# Patient Record
Sex: Male | Born: 1990 | Race: White | Hispanic: No | Marital: Single | Smoking: Current every day smoker
Health system: Southern US, Community
[De-identification: ages and names within clinical notes are randomized; demographics above are authoritative.]

---

## 2018-07-28 ENCOUNTER — Encounter (HOSPITAL_COMMUNITY): Payer: Self-pay

## 2018-07-28 ENCOUNTER — Emergency Department (HOSPITAL_COMMUNITY): Payer: Self-pay

## 2018-07-28 ENCOUNTER — Other Ambulatory Visit: Payer: Self-pay

## 2018-07-28 ENCOUNTER — Emergency Department (HOSPITAL_COMMUNITY)
Admission: EM | Admit: 2018-07-28 | Discharge: 2018-07-28 | Disposition: A | Discharge status: Home / Self Care | Payer: Self-pay | Attending: Emergency Medicine | Admitting: Emergency Medicine

## 2018-07-28 DIAGNOSIS — J40 Bronchitis, not specified as acute or chronic: Secondary | ICD-10-CM | POA: Insufficient documentation

## 2018-07-28 DIAGNOSIS — F17210 Nicotine dependence, cigarettes, uncomplicated: Secondary | ICD-10-CM | POA: Insufficient documentation

## 2018-07-28 MED ORDER — PREDNISONE 50 MG PO TABS: 50.0000 mg | ORAL_TABLET | Freq: Every day | ORAL | 0 refills | Status: AC

## 2018-07-28 MED ORDER — PREDNISONE 20 MG PO TABS
60.0000 mg | ORAL_TABLET | Freq: Once | ORAL | Status: AC
  Administered 2018-07-28: 60 mg via ORAL
  Filled 2018-07-28: qty 3

## 2018-07-28 MED ORDER — ALBUTEROL SULFATE HFA 108 (90 BASE) MCG/ACT IN AERS
1.0000 | INHALATION_SPRAY | RESPIRATORY_TRACT | Status: DC | PRN
  Administered 2018-07-28: 2 via RESPIRATORY_TRACT
  Filled 2018-07-28 (×2): qty 6.7

## 2018-07-28 MED ORDER — IPRATROPIUM-ALBUTEROL 0.5-2.5 (3) MG/3ML IN SOLN
3.0000 mL | Freq: Once | RESPIRATORY_TRACT | Status: AC
  Administered 2018-07-28: 3 mL via RESPIRATORY_TRACT
  Filled 2018-07-28: qty 3

## 2018-07-28 NOTE — ED Provider Notes (Signed)
[facility]-EMERGENCY DEPT Provider Note   CSN: [id number] Arrival date & time: 07/28/18  1042    History   Chief Complaint Chief Complaint  Patient presents with  . Cough    HPI Alexander Chang is a 28 y.o. male.   HPI Patient presented to the emergency room for evaluation of shortness of breath.  Patient states he felt like he was getting a flulike illness over the weekend.  He felt feverish and chilled.  Those symptoms resolved but he continues to feel congestion in his chest.  He has been coughing.  Last evening his symptoms got worse and he was feeling short of breath.  He continues to feel like he has difficulty getting in a deep breath.  He denies any trouble with chest pain.  He denies any leg swelling.  He denies any abdominal pain.  Patient denies any history of asthma.  He does smoke cigarettes. History reviewed. No pertinent past medical history.  There are no active problems to display for this patient.   History reviewed. No pertinent surgical history.      Home Medications    Prior to Admission medications   Medication Sig Start Date End Date Taking? Authorizing Provider  predniSONE (DELTASONE) 50 MG tablet Take 1 tablet (50 mg total) by mouth daily. 07/28/18   Linwood Dibbles, MD    Family History History reviewed. No pertinent family history.  Social History Social History   Tobacco Use  . Smoking status: Current Every Day Smoker    Types: Cigarettes  . Smokeless tobacco: Never Used  Substance Use Topics  . Alcohol use: Never    Frequency: Never  . Drug use: Never     Allergies   Patient has no known allergies.   Review of Systems Review of Systems  All other systems reviewed and are negative.    Physical Exam Updated Vital Signs BP 140/89 (BP Location: Right Arm)   Pulse 87   Temp 98.3 F (36.8 C) (Oral)   Resp 18   Ht 1.702 m (5\' 7" )   Wt 90.7 kg   SpO2 99%   BMI 31.32 kg/m   Physical Exam Vitals signs and  nursing note reviewed.  Constitutional:      General: He is not in acute distress.    Appearance: He is well-developed.  HENT:     Head: Normocephalic and atraumatic.     Right Ear: External ear normal.     Left Ear: External ear normal.  Eyes:     General: No scleral icterus.       Right eye: No discharge.        Left eye: No discharge.     Conjunctiva/sclera: Conjunctivae normal.  Neck:     Musculoskeletal: Neck supple.     Trachea: No tracheal deviation.  Cardiovascular:     Rate and Rhythm: Normal rate and regular rhythm.  Pulmonary:     Effort: Pulmonary effort is normal. No respiratory distress.     Breath sounds: No stridor. Wheezing present. No rales.  Abdominal:     General: Bowel sounds are normal. There is no distension.     Palpations: Abdomen is soft.     Tenderness: There is no abdominal tenderness. There is no guarding or rebound.  Musculoskeletal:        General: No tenderness.  Skin:    General: Skin is warm and dry.     Findings: No rash.  Neurological:     Mental  Status: He is alert.     Cranial Nerves: No cranial nerve deficit (no facial droop, extraocular movements intact, no slurred speech).     Sensory: No sensory deficit.     Motor: No abnormal muscle tone or seizure activity.     Coordination: Coordination normal.      ED Treatments / Results  Labs (all labs ordered are listed, but only abnormal results are displayed) Labs Reviewed - No data to display  EKG None  Radiology Dg Chest 2 View  Result Date: 07/28/2018 CLINICAL DATA:  Cough for the past few days. EXAM: CHEST - 2 VIEW COMPARISON:  None. FINDINGS: Lungs clear. Heart size normal. No pneumothorax or pleural fluid. No bony abnormality. IMPRESSION: Normal chest. Electronically Signed   By: Drusilla Kanner M.D.   On: 07/28/2018 11:44    Procedures Procedures (including critical care time)  Medications Ordered in ED Medications  albuterol (PROVENTIL HFA;VENTOLIN HFA) 108 (90  Base) MCG/ACT inhaler 1-2 puff (has no administration in time range)  ipratropium-albuterol (DUONEB) 0.5-2.5 (3) MG/3ML nebulizer solution 3 mL (3 mLs Nebulization Given 07/28/18 1135)  predniSONE (DELTASONE) tablet 60 mg (60 mg Oral Given 07/28/18 1126)     Initial Impression / Assessment and Plan / ED Course  I have reviewed the triage vital signs and the nursing notes.  Pertinent labs & imaging results that were available during my care of the patient were reviewed by me and considered in my medical decision making (see chart for details).  Patient presented to the emergency room with complaints of shortness of breath.  On exam he had significant wheezing.  Patient denies any prior history of asthma but he does smoke cigarettes.  Chest x-ray did not show any evidence of abnormality.  I suspect the patient had bronchospasm associated with a recent viral illness.  Patient improved after a breathing treatment in the emergency room.  I will discharge him home with an inhaler as well as prednisone.  Final Clinical Impressions(s) / ED Diagnoses   Final diagnoses:  Bronchitis    ED Discharge Orders         Ordered    predniSONE (DELTASONE) 50 MG tablet  Daily     07/28/18 1158           Linwood Dibbles, MD 07/28/18 1159

## 2018-07-28 NOTE — ED Triage Notes (Signed)
Pt presents with c/o cough for the past few days. Pt reports he felt like he was getting flu-like symptoms several days ago and reports that he has been around people who have been sick.

## 2018-07-28 NOTE — Discharge Instructions (Signed)
Use the inhaler as prescribed, take the steroids until they are complete, follow-up with your primary care doctor in a week or so to make sure your symptoms continue to improve.  Return as needed for worsening symptoms

## 2018-08-01 ENCOUNTER — Emergency Department (HOSPITAL_COMMUNITY): Payer: Self-pay

## 2018-08-01 ENCOUNTER — Emergency Department (HOSPITAL_COMMUNITY)
Admission: EM | Admit: 2018-08-01 | Discharge: 2018-08-01 | Disposition: A | Discharge status: Home / Self Care | Payer: Self-pay | Attending: Emergency Medicine | Admitting: Emergency Medicine

## 2018-08-01 ENCOUNTER — Encounter (HOSPITAL_COMMUNITY): Payer: Self-pay | Admitting: Emergency Medicine

## 2018-08-01 ENCOUNTER — Other Ambulatory Visit: Payer: Self-pay

## 2018-08-01 DIAGNOSIS — Z79899 Other long term (current) drug therapy: Secondary | ICD-10-CM | POA: Insufficient documentation

## 2018-08-01 DIAGNOSIS — R0981 Nasal congestion: Secondary | ICD-10-CM | POA: Insufficient documentation

## 2018-08-01 DIAGNOSIS — F17210 Nicotine dependence, cigarettes, uncomplicated: Secondary | ICD-10-CM | POA: Insufficient documentation

## 2018-08-01 DIAGNOSIS — J40 Bronchitis, not specified as acute or chronic: Secondary | ICD-10-CM | POA: Insufficient documentation

## 2018-08-01 MED ORDER — ALBUTEROL SULFATE HFA 108 (90 BASE) MCG/ACT IN AERS: 1.0000 | INHALATION_SPRAY | Freq: Four times a day (QID) | RESPIRATORY_TRACT | 0 refills | Status: AC | PRN

## 2018-08-01 MED ORDER — IPRATROPIUM-ALBUTEROL 0.5-2.5 (3) MG/3ML IN SOLN
3.0000 mL | Freq: Once | RESPIRATORY_TRACT | Status: AC
  Administered 2018-08-01: 3 mL via RESPIRATORY_TRACT
  Filled 2018-08-01: qty 3

## 2018-08-01 MED ORDER — AMOXICILLIN-POT CLAVULANATE 875-125 MG PO TABS: 1.0000 | ORAL_TABLET | Freq: Two times a day (BID) | ORAL | 0 refills | Status: AC

## 2018-08-01 NOTE — Discharge Instructions (Signed)
You have been seen today for congestion and cough. Please read and follow all provided instructions. Return to the emergency room for worsening condition or new concerning symptoms.    1. Medications:  Prescription sent to your pharmacy for Augmentin.  This is a medication for sinus infection.  Please take as prescribed.  You can also use over-the-counter Flonase for nasal congestion. Also sent prescription for an albuterol inhaler to your pharmacy.  You can use this every 6 hours as needed for shortness of breath/wheezing. Continue usual home medications  Take medications as prescribed. Please review all of the medicines and only take them if you do not have an allergy to them.  2. Treatment: rest, drink plenty of fluids, stop smoking  3. Follow Up: Please follow up with your primary doctor in 2-5 days for discussion of your diagnoses and further evaluation after today's visit; Call today to arrange your follow up.  If you do not have a primary care doctor use the resource guide provided to find one;   It is also a possibility that you have an allergic reaction to any of the medicines that you have been prescribed - Everybody reacts differently to medications and while MOST people have no trouble with most medicines, you may have a reaction such as nausea, vomiting, rash, swelling, shortness of breath. If this is the case, please stop taking the medicine immediately and contact your physician.  ?

## 2018-08-01 NOTE — ED Notes (Signed)
Less wheezing noted after treatment. Pt still having expiratory wheezing

## 2018-08-01 NOTE — ED Notes (Signed)
Pt 97% on RA while ambulating.

## 2018-08-01 NOTE — ED Notes (Signed)
Bed: WTR8 Expected date:  Expected time:  Means of arrival:  Comments: 

## 2018-08-01 NOTE — ED Provider Notes (Signed)
[facility]-EMERGENCY DEPT Provider Note   CSN: [id number] Arrival date & time: 08/01/18  1410    History   Chief Complaint Chief Complaint  Patient presents with  . Nasal Congestion  . Cough    HPI Alexander Chang is a 28 y.o. male with history of tobacco abuse presenting to emergency department today with chief complaint of congestion and cough x1 week.  Patient states he was seen here in the ED x5 days ago and diagnosed with bronchitis.  He has been using his albuterol inhaler without relief.  He estimates he uses it at least 6 times per day.  He now has productive cough with clear sputum.  He also feels short of breath and has been wheezing, this has been constant.  Patient reports nasal congestion with green mucus.  He reports feeling pressure in his face when leaning forward.  Denies any associated pain he has tried over-the-counter sinus medications without relief.  Also admits to subjective fever last night.  He denies chest pain, sore throat, headache, neck pain, nausea, vomiting. History provided by patient.     History reviewed. No pertinent past medical history.  There are no active problems to display for this patient.   History reviewed. No pertinent surgical history.      Home Medications    Prior to Admission medications   Medication Sig Start Date End Date Taking? Authorizing Provider  ibuprofen (ADVIL,MOTRIN) 800 MG tablet Take 800 mg by mouth every 8 (eight) hours as needed for fever or pain. 12/11/16  Yes [provider]  predniSONE (DELTASONE) 50 MG tablet Take 1 tablet (50 mg total) by mouth daily. 07/28/18  Yes Linwood Dibbles, MD  albuterol (PROVENTIL HFA;VENTOLIN HFA) 108 (90 Base) MCG/ACT inhaler Inhale 1-2 puffs into the lungs every 6 (six) hours as needed for wheezing or shortness of breath. 08/01/18   ,  E, PA-C  amoxicillin-clavulanate (AUGMENTIN) 875-125 MG tablet Take 1 tablet by mouth every 12 (twelve) hours.  08/01/18   , Caroleen Hamman, PA-C    Family History No family history on file.  Social History Social History   Tobacco Use  . Smoking status: Current Every Day Smoker    Types: Cigarettes  . Smokeless tobacco: Never Used  Substance Use Topics  . Alcohol use: Never    Frequency: Never  . Drug use: Never     Allergies   Patient has no known allergies.   Review of Systems Review of Systems  Constitutional: Positive for fever.  HENT: Positive for congestion, sinus pressure and sinus pain. Negative for ear discharge, ear pain and sore throat.   Eyes: Negative for pain and discharge.  Respiratory: Positive for cough and wheezing.   Gastrointestinal: Negative for abdominal pain, diarrhea, nausea and vomiting.  All other systems reviewed and are negative.    Physical Exam Updated Vital Signs BP (!) 152/87 (BP Location: Right Arm)   Pulse 92   Temp 98.7 F (37.1 C) (Oral)   Resp 20   Ht 5\' 7"  (1.702 m)   Wt 90.7 kg   SpO2 97%   BMI 31.32 kg/m   Physical Exam Vitals signs and nursing note reviewed.  Constitutional:      General: He is not in acute distress.    Appearance: He is well-developed. He is not toxic-appearing.  HENT:     Head: Normocephalic and atraumatic.     Right Ear: Tympanic membrane and external ear normal.     Left Ear: Tympanic  membrane and external ear normal.     Nose: Congestion present.     Mouth/Throat:     Mouth: Mucous membranes are moist.     Pharynx: Oropharynx is clear. No posterior oropharyngeal erythema.  Eyes:     General: No scleral icterus.       Right eye: No discharge.        Left eye: No discharge.     Extraocular Movements: Extraocular movements intact.     Conjunctiva/sclera: Conjunctivae normal.     Pupils: Pupils are equal, round, and reactive to light.  Neck:     Musculoskeletal: Normal range of motion.  Cardiovascular:     Rate and Rhythm: Normal rate and regular rhythm.     Pulses: Normal pulses.     Heart  sounds: Normal heart sounds.  Pulmonary:     Effort: Pulmonary effort is normal.     Breath sounds: Normal breath sounds.     Comments: Expiratory wheeze heard in all fields. Patient is speaking in full sentences without accessory muscle use, no nasal flaring, no pursed lip breathing. Abdominal:     General: There is no distension.     Palpations: Abdomen is soft.     Tenderness: There is no abdominal tenderness.  Musculoskeletal: Normal range of motion.  Skin:    General: Skin is warm and dry.  Neurological:     Mental Status: He is oriented to person, place, and time.     Comments: Fluent speech, no facial droop.  Psychiatric:        Behavior: Behavior normal.      ED Treatments / Results  Labs (all labs ordered are listed, but only abnormal results are displayed) Labs Reviewed - No data to display  EKG None  Radiology Dg Chest 2 View  Result Date: 08/01/2018 CLINICAL DATA:  Cough, fever and runny nose for 1 week. EXAM: CHEST - 2 VIEW COMPARISON:  Chest radiograph July 28, 2018 FINDINGS: Cardiomediastinal silhouette is unremarkable for this low inspiratory examination with crowded vasculature markings. The lungs are clear without pleural effusions or focal consolidations. Trachea projects midline and there is no pneumothorax. Included soft tissue planes and osseous structures are non-suspicious. IMPRESSION: Negative. Electronically Signed   By: Awilda Metro M.D.   On: 08/01/2018 13:34    Procedures Procedures (including critical care time)  Medications Ordered in ED Medications  ipratropium-albuterol (DUONEB) 0.5-2.5 (3) MG/3ML nebulizer solution 3 mL (3 mLs Nebulization Given 08/01/18 1108)  ipratropium-albuterol (DUONEB) 0.5-2.5 (3) MG/3ML nebulizer solution 3 mL (3 mLs Nebulization Given 08/01/18 1219)     Initial Impression / Assessment and Plan / ED Course  I have reviewed the triage vital signs and the nursing notes.  Pertinent labs & imaging results that  were available during my care of the patient were reviewed by me and considered in my medical decision making (see chart for details).    Patient complaining of symptoms of sinusitis.  Severe symptoms have been present for 7 days. He admits to purulent nasal discharge and maxillary sinus pain.  Concern for acute bacterial rhinosinusitis given the severity of his symptoms.  He has failed treatment with over-the-counter sinus medications.  Patient discharged with Augmentin.  Instructions given for warm saline nasal wash.  Patient was diagnosed with bronchospasm associated with viral illness.  He is wheezing on presentation.  His wheezing improved after 2 albuterol treatments.  Given his significant wheezing and minimal improvement in 1 week chest x-ray ordered and viewed by me.  It is negative for acute infectious process.  Discharged with prescription for albuterol inhaler and recommend smoking cessation.  Patient is hemodynamically stable, in NAD, and able to ambulate in the ED. SpO2 with ambulattion >95% on room air. Patient is comfortable with above plan and is stable for discharge at this time. All questions were answered prior to disposition. Strict return precautions for returning to the ED were discussed. Encouraged follow up with PCP.         Final Clinical Impressions(s) / ED Diagnoses   Final diagnoses:  Bronchitis  Sinus congestion    ED Discharge Orders         Ordered    amoxicillin-clavulanate (AUGMENTIN) 875-125 MG tablet  Every 12 hours     08/01/18 1345    albuterol (PROVENTIL HFA;VENTOLIN HFA) 108 (90 Base) MCG/ACT inhaler  Every 6 hours PRN     08/01/18 1345           , Caroleen Hamman, PA-C 08/01/18 1402    Samuel Jester, DO 08/06/18 1842

## 2018-08-01 NOTE — ED Triage Notes (Signed)
Pt reports that he still having congestion and cough since last week. Reports had bronchitis last Thursday. This morning felt equilibrium felt off.

## 2019-05-16 ENCOUNTER — Emergency Department (HOSPITAL_COMMUNITY): Payer: Self-pay

## 2019-05-16 ENCOUNTER — Encounter (HOSPITAL_COMMUNITY): Payer: Self-pay | Admitting: Emergency Medicine

## 2019-05-16 ENCOUNTER — Other Ambulatory Visit: Payer: Self-pay

## 2019-05-16 DIAGNOSIS — R0602 Shortness of breath: Secondary | ICD-10-CM | POA: Insufficient documentation

## 2019-05-16 DIAGNOSIS — F17210 Nicotine dependence, cigarettes, uncomplicated: Secondary | ICD-10-CM | POA: Insufficient documentation

## 2019-05-16 DIAGNOSIS — J40 Bronchitis, not specified as acute or chronic: Secondary | ICD-10-CM | POA: Insufficient documentation

## 2019-05-16 DIAGNOSIS — Z20828 Contact with and (suspected) exposure to other viral communicable diseases: Secondary | ICD-10-CM | POA: Insufficient documentation

## 2019-05-16 DIAGNOSIS — R5383 Other fatigue: Secondary | ICD-10-CM | POA: Insufficient documentation

## 2019-05-16 DIAGNOSIS — R05 Cough: Secondary | ICD-10-CM | POA: Insufficient documentation

## 2019-05-16 NOTE — ED Triage Notes (Signed)
Patient here from home with complaints of cough, SOB, chills for 24 hours. Denies COVID exposure.

## 2019-05-16 NOTE — ED Provider Notes (Signed)
MSE was initiated and I personally evaluated the patient and placed orders (if any) at  9:50 PM on May 16, 2019.  The patient appears stable so that the remainder of the MSE may be completed by another provider.  28 year old male who presents for evaluation of 24 hours of cough, congestion, shortness of breath, chills.  He states that cough is productive of phlegm.  He has not measured any fever.  He does not know of any COVID-19 exposure.  Alexander Chang was evaluated in Emergency Department on 05/16/2019 for the symptoms described in the history of present illness. He was evaluated in the context of the global COVID-19 pandemic, which necessitated consideration that the patient might be at risk for infection with the SARS-CoV-2 virus that causes COVID-19. Institutional protocols and algorithms that pertain to the evaluation of patients at risk for COVID-19 are in a state of rapid change based on information released by regulatory bodies including the CDC and federal and state organizations. These policies and algorithms were followed during the patient's care in the ED.  Portions of this note were generated with [employer]. Dictation errors may occur despite best attempts at proofreading.     Volanda Napoleon, PA-C 05/16/19 2150    Varney Biles, MD 05/22/19 [phone]

## 2019-05-17 ENCOUNTER — Emergency Department (HOSPITAL_COMMUNITY)
Admission: EM | Admit: 2019-05-17 | Discharge: 2019-05-17 | Disposition: A | Discharge status: Home / Self Care | Payer: Self-pay | Attending: Emergency Medicine | Admitting: Emergency Medicine

## 2019-05-17 DIAGNOSIS — J40 Bronchitis, not specified as acute or chronic: Secondary | ICD-10-CM

## 2019-05-17 LAB — POC SARS CORONAVIRUS 2 AG -  ED: SARS Coronavirus 2 Ag: NEGATIVE

## 2019-05-17 MED ORDER — IPRATROPIUM BROMIDE HFA 17 MCG/ACT IN AERS
2.0000 | INHALATION_SPRAY | Freq: Once | RESPIRATORY_TRACT | Status: AC
  Administered 2019-05-17: 2 via RESPIRATORY_TRACT
  Filled 2019-05-17: qty 12.9

## 2019-05-17 NOTE — Discharge Instructions (Addendum)
You were seen today for upper respiratory symptoms.  Your initial Covid test is negative.  Repeat Covid testing is pending.  You need to isolate until repeat Covid testing has returned.  In the meantime you may use your inhaler for symptoms.  If you develop worsening shortness of breath or any new or worsening symptoms you should be reevaluated.

## 2019-05-17 NOTE — ED Provider Notes (Signed)
[facility] DEPT Provider Note   CSN: [id number] Arrival date & time: 05/16/19  2121     History Chief Complaint  Patient presents with  . Cough  . Chills  . Shortness of Breath    Alexander Chang is a 28 y.o. male.  HPI     This is a 28 year old male who presents with chills, cough, shortness of breath.  Patient reports onset of symptoms within the last 24 to 48 hours.  He states that he has had a mildly productive cough.  Has had some increasing shortness of breath, chills and fatigue.  No known sick contacts.  He does work in a plant with many other people.  He reports that he masks consistently.  He is a current smoker.  Has a history of overdose with aspiration pneumonia requiring ICU ventilator support.  Patient has not documented any fevers at home.  Denies any chest pain, abdominal pain, nausea, vomiting.  History reviewed. No pertinent past medical history.  There are no problems to display for this patient.   History reviewed. No pertinent surgical history.     No family history on file.  Social History   Tobacco Use  . Smoking status: Current Every Day Smoker    Types: Cigarettes  . Smokeless tobacco: Never Used  Substance Use Topics  . Alcohol use: Never  . Drug use: Never    Home Medications Prior to Admission medications   Medication Sig Start Date End Date Taking? Authorizing Provider  ibuprofen (ADVIL,MOTRIN) 800 MG tablet Take 800 mg by mouth every 8 (eight) hours as needed for fever or pain. 12/11/16  Yes [provider]  albuterol (PROVENTIL HFA;VENTOLIN HFA) 108 (90 Base) MCG/ACT inhaler Inhale 1-2 puffs into the lungs every 6 (six) hours as needed for wheezing or shortness of breath. Patient not taking: Reported on 05/17/2019 08/01/18   Albrizze, Verline Lema E, PA-C  amoxicillin-clavulanate (AUGMENTIN) 875-125 MG tablet Take 1 tablet by mouth every 12 (twelve) hours. Patient not taking: Reported on 05/17/2019  08/01/18   Albrizze, Verline Lema E, PA-C  predniSONE (DELTASONE) 50 MG tablet Take 1 tablet (50 mg total) by mouth daily. Patient not taking: Reported on 05/17/2019 07/28/18   Dorie Rank, MD    Allergies    Patient has no known allergies.  Review of Systems   Review of Systems  Constitutional: Positive for chills and fatigue. Negative for fever.  Respiratory: Positive for cough and shortness of breath.   Cardiovascular: Negative for chest pain.  Gastrointestinal: Negative for abdominal pain, nausea and vomiting.  Genitourinary: Negative for dysuria.  All other systems reviewed and are negative.   Physical Exam Updated Vital Signs BP 139/90 (BP Location: Right Arm)   Pulse (!) 108   Temp 98.1 F (36.7 C) (Oral)   Resp 20   Ht 1.676 m (5\' 6" )   Wt 90.7 kg   SpO2 100%   BMI 32.28 kg/m   Physical Exam Vitals and nursing note reviewed.  Constitutional:      Appearance: He is well-developed. He is not ill-appearing.  HENT:     Head: Normocephalic and atraumatic.  Eyes:     Pupils: Pupils are equal, round, and reactive to light.  Cardiovascular:     Rate and Rhythm: Normal rate and regular rhythm.     Heart sounds: Normal heart sounds. No murmur.  Pulmonary:     Effort: Pulmonary effort is normal. No respiratory distress.     Breath sounds: Wheezing present.  Abdominal:     General: Bowel sounds are normal.     Palpations: Abdomen is soft.     Tenderness: There is no abdominal tenderness. There is no rebound.  Musculoskeletal:     Cervical back: Neck supple.  Lymphadenopathy:     Cervical: No cervical adenopathy.  Skin:    General: Skin is warm and dry.  Neurological:     Mental Status: He is alert and oriented to person, place, and time.  Psychiatric:        Mood and Affect: Mood normal.     ED Results / Procedures / Treatments   Labs (all labs ordered are listed, but only abnormal results are displayed) Labs Reviewed  NOVEL CORONAVIRUS, NAA (HOSP ORDER, SEND-OUT  TO REF LAB; TAT 18-24 HRS)  POC SARS CORONAVIRUS 2 AG -  ED    EKG EKG Interpretation  Date/Time:  Tuesday May 16 2019 21:43:08 EST Ventricular Rate:  96 PR Interval:    QRS Duration: 87 QT Interval:  307 QTC Calculation: 388 R Axis:   55 Text Interpretation: Sinus rhythm Borderline short PR interval Low voltage, precordial leads Confirmed by Ross MarcusHorton, Shital Crayton ([id number]) on 05/17/2019 2:35:24 AM   Radiology DG Chest 2 View  Result Date: 05/16/2019 CLINICAL DATA:  Cough EXAM: CHEST - 2 VIEW COMPARISON:  August 01, 2018 FINDINGS: The heart size and mediastinal contours are within normal limits. Both lungs are clear. The visualized skeletal structures are unremarkable. IMPRESSION: No active cardiopulmonary disease. Electronically Signed   By: Katherine Mantlehristopher  Alexander M.D.   On: 05/16/2019 22:05    Procedures Procedures (including critical care time)  Medications Ordered in ED Medications  ipratropium (ATROVENT HFA) inhaler 2 puff (2 puffs Inhalation Given 05/17/19 0314)    ED Course  I have reviewed the triage vital signs and the nursing notes.  Pertinent labs & imaging results that were available during my care of the patient were reviewed by me and considered in my medical decision making (see chart for details).    MDM Rules/Calculators/A&P                      Patient presents with upper respiratory symptoms for 2 days.  He is overall nontoxic and vital signs are reassuring.  He is a current smoker.  No known sick contacts.  He has slight wheezing on exam.  He was given an inhaler.  Chest x-ray without evidence of pneumonia.  Initial Covid testing is negative.  Repeat testing is pending.  Patient was instructed to isolate until repeat testing has resulted.  In the meantime use his inhaler for any symptoms.  Alexander Chang was evaluated in Emergency Department on 05/17/2019 for the symptoms described in the history of present illness. He was evaluated in the context of the global  COVID-19 pandemic, which necessitated consideration that the patient might be at risk for infection with the SARS-CoV-2 virus that causes COVID-19. Institutional protocols and algorithms that pertain to the evaluation of patients at risk for COVID-19 are in a state of rapid change based on information released by regulatory bodies including the CDC and federal and state organizations. These policies and algorithms were followed during the patient's care in the ED.  After history, exam, and medical workup I feel the patient has been appropriately medically screened and is safe for discharge home. Pertinent diagnoses were discussed with the patient. Patient was given return precautions.    Final Clinical Impression(s) / ED Diagnoses Final diagnoses:  Bronchitis  Rx / DC Orders ED Discharge Orders    None       Roen Macgowan, Mayer Masker, MD 05/17/19 [phone]

## 2019-05-18 LAB — NOVEL CORONAVIRUS, NAA (HOSP ORDER, SEND-OUT TO REF LAB; TAT 18-24 HRS): SARS-CoV-2, NAA: NOT DETECTED

## 2019-05-19 ENCOUNTER — Telehealth: Payer: Self-pay

## 2019-05-19 NOTE — Telephone Encounter (Signed)
Per pt's. Request, faxed COVID result to [employer] [facility], attn: Jackqulyn Livings @ [phone].

## 2019-05-19 NOTE — Telephone Encounter (Signed)
Patient needs result faxed to [facility] Display Attn: Jackqulyn Livings [phone]

## 2021-07-26 IMAGING — CR DG CHEST 2V
2 series · 2 of 2 positions shown · non-contrast
Comparison: August 01, 2018

CLINICAL DATA: Cough

EXAM:
CHEST - 2 VIEW

[w chest pa]
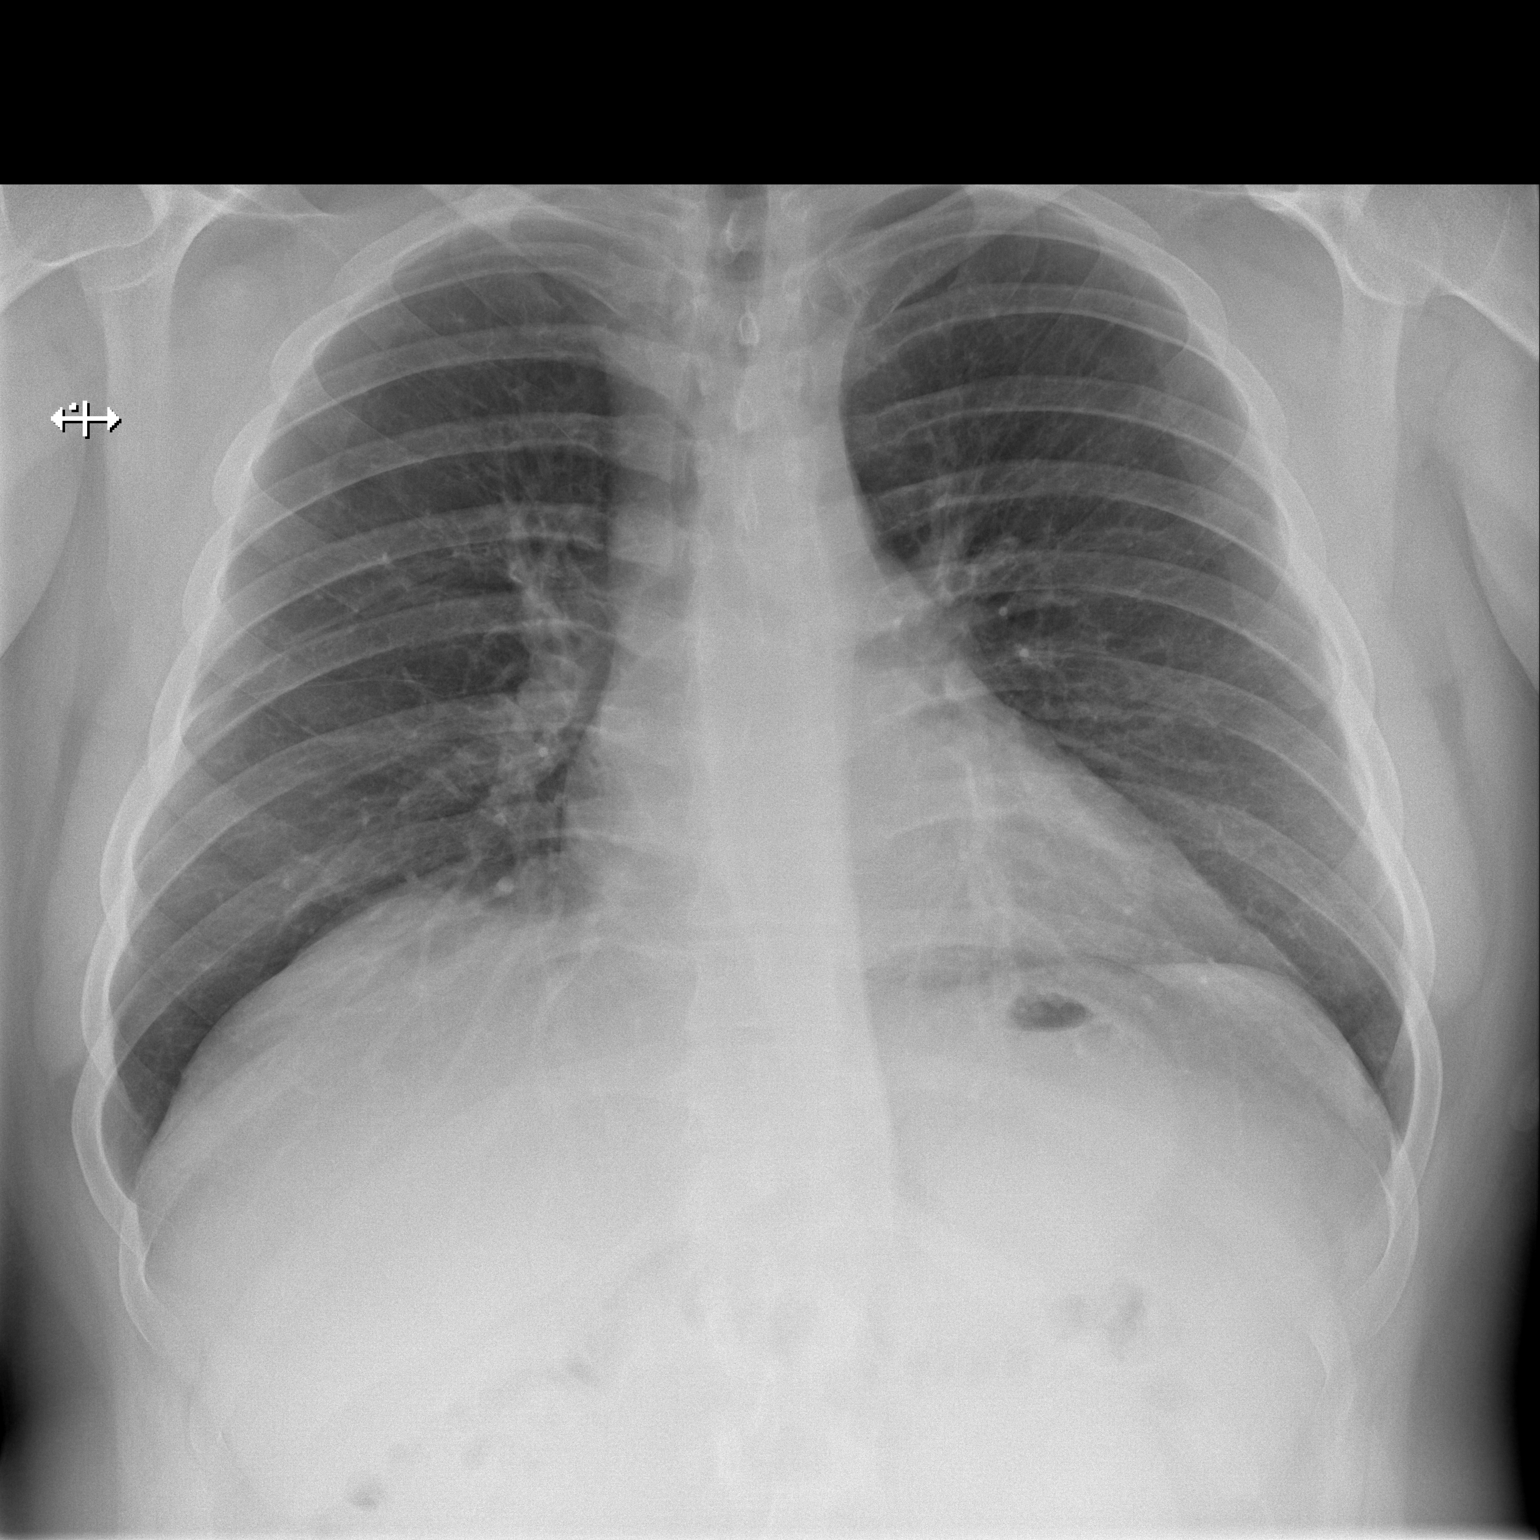

[w chest lat]
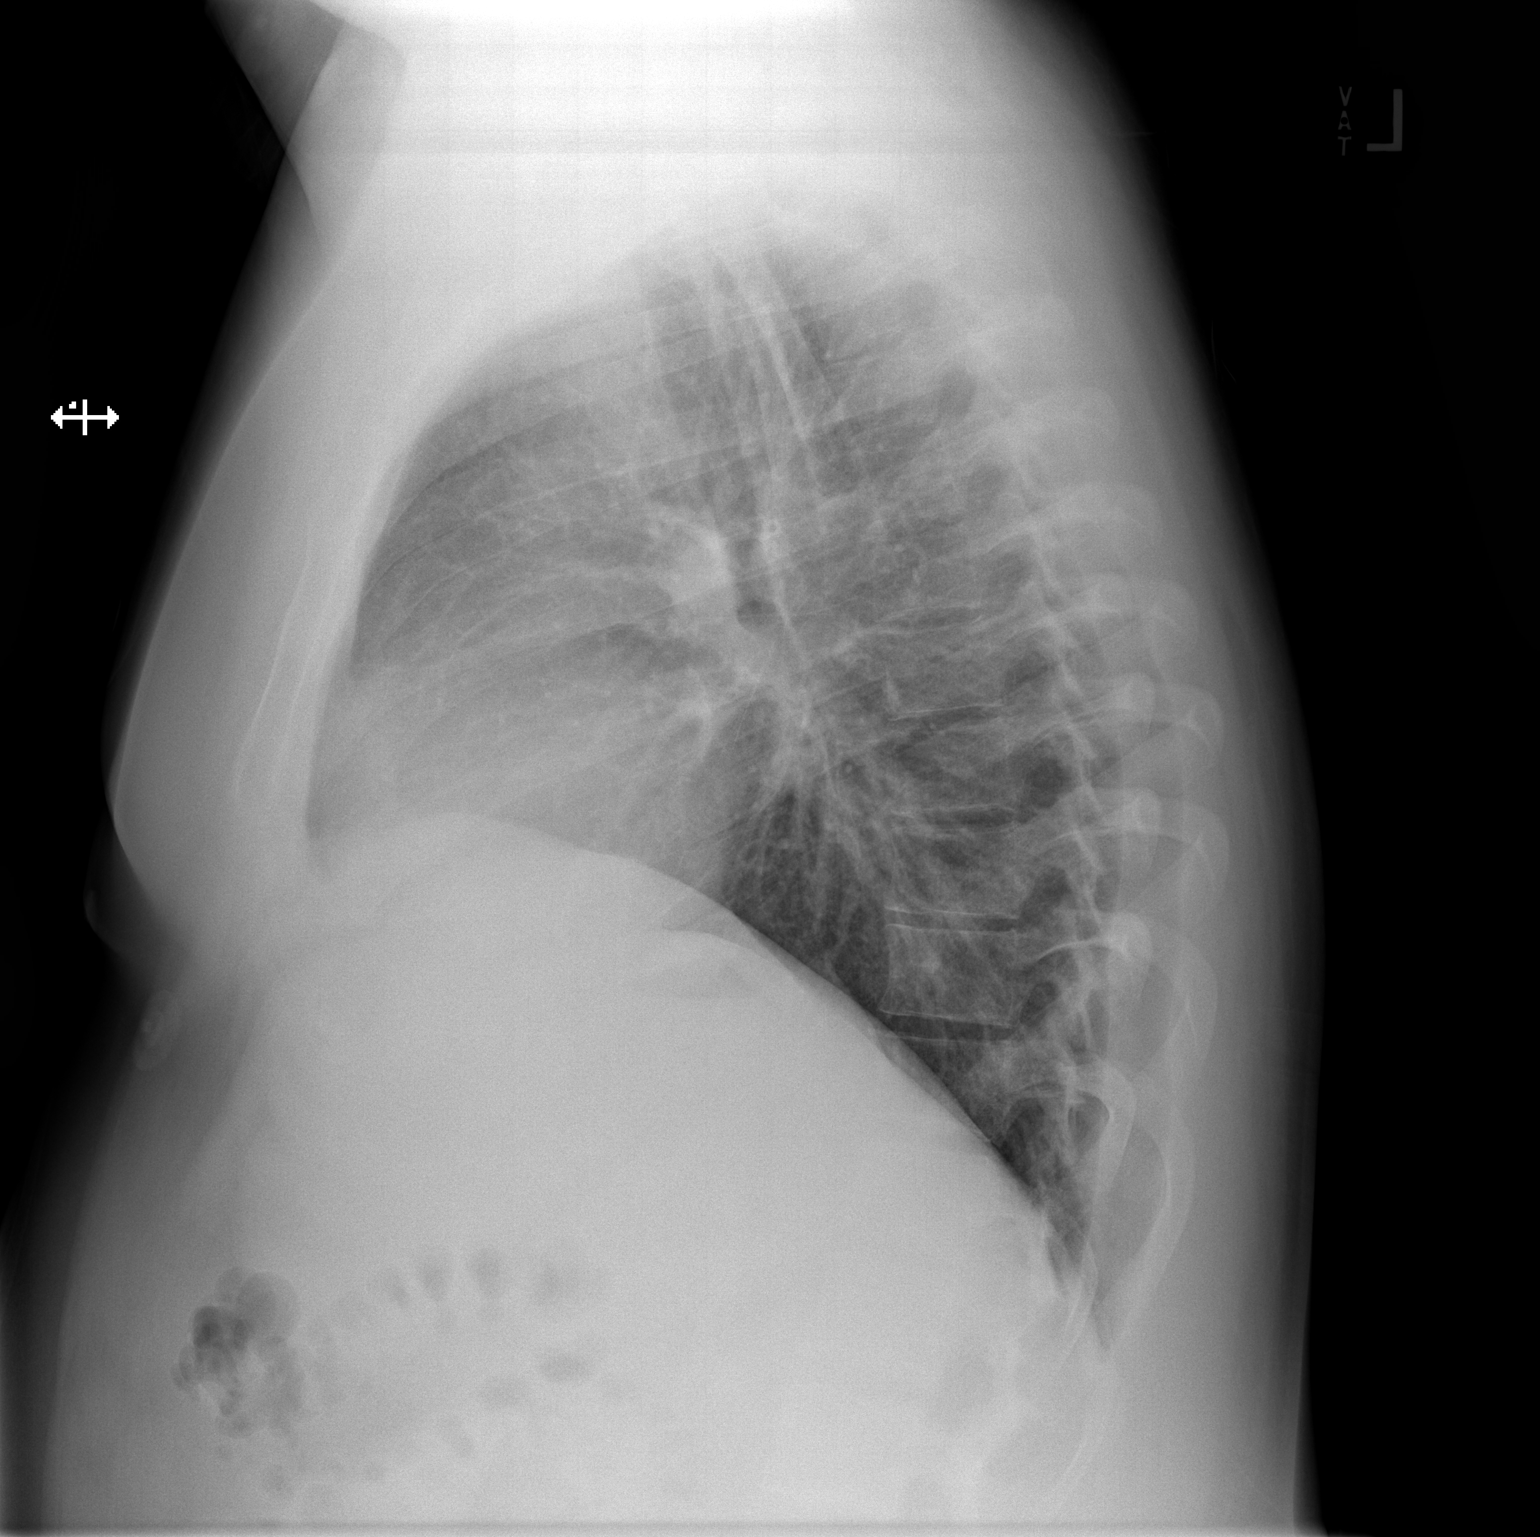

[2 of 2 positions shown; findings below may reference images not displayed]

FINDINGS: The heart size and mediastinal contours are within normal limits.
Both lungs are clear. The visualized skeletal structures are
unremarkable.
IMPRESSION: No active cardiopulmonary disease.
